# Patient Record
Sex: Male | Born: 1980 | Race: Black or African American | Hispanic: No | Marital: Married | Smoking: Former smoker
Health system: Southern US, Community
[De-identification: ages and names within clinical notes are randomized; demographics above are authoritative.]

## PROBLEM LIST (undated history)

## (undated) DIAGNOSIS — J45909 Unspecified asthma, uncomplicated: Secondary | ICD-10-CM

## (undated) DIAGNOSIS — S060XAA Concussion with loss of consciousness status unknown, initial encounter: Secondary | ICD-10-CM

## (undated) HISTORY — DX: Concussion with loss of consciousness status unknown, initial encounter: S06.0XAA

---

## 2019-01-29 ENCOUNTER — Encounter (HOSPITAL_COMMUNITY): Payer: Self-pay

## 2019-01-29 ENCOUNTER — Other Ambulatory Visit: Payer: Self-pay

## 2019-01-29 ENCOUNTER — Emergency Department (HOSPITAL_COMMUNITY)
Admission: EM | Admit: 2019-01-29 | Discharge: 2019-01-29 | Disposition: A | Discharge status: Home / Self Care | Payer: Self-pay | Attending: Emergency Medicine | Admitting: Emergency Medicine

## 2019-01-29 DIAGNOSIS — L299 Pruritus, unspecified: Secondary | ICD-10-CM | POA: Insufficient documentation

## 2019-01-29 DIAGNOSIS — L259 Unspecified contact dermatitis, unspecified cause: Secondary | ICD-10-CM | POA: Insufficient documentation

## 2019-01-29 MED ORDER — PREDNISONE 10 MG (21) PO TBPK: ORAL_TABLET | Freq: Every day | ORAL | 0 refills | Status: DC

## 2019-01-29 NOTE — ED Provider Notes (Signed)
[facility] Provider Note   CSN: [id number] Arrival date & time: 01/29/19  1502    History   Chief Complaint Chief Complaint  Patient presents with  . Rash    HPI Gabriel Clarke is a 38 y.o. male.     38 year old male presents with complaint of rash to his arms, legs, buttock area.  Patient states rash has been intermittent for the past week, resolves overnight and then throughout the day returns.  Rash is described as a fine bumpy rash.  Patient has tried applying cortisone cream to the area which seems to help with the rash and with the itching however rash returns.  Patient is also taking Zyrtec daily.  Patient has tried changing to sensitive detergents without improvement in symptoms.  No other identifiable factors.  No other complaints or concerns.  No one else in the house noted to have a rash.     History reviewed. No pertinent past medical history.  There are no active problems to display for this patient.   History reviewed. No pertinent surgical history.      Home Medications    Prior to Admission medications   Medication Sig Start Date End Date Taking? Authorizing Provider  predniSONE (STERAPRED UNI-PAK 21 TAB) 10 MG (21) TBPK tablet Take by mouth daily. Take 6 tabs by mouth daily  for 2 days, then 5 tabs for 2 days, then 4 tabs for 2 days, then 3 tabs for 2 days, 2 tabs for 2 days, then 1 tab by mouth daily for 2 days 01/29/19   Jeannie FendMurphy,  A, PA-C    Family History History reviewed. No pertinent family history.  Social History Social History   Tobacco Use  . Smoking status: Never Smoker  . Smokeless tobacco: Never Used  Substance Use Topics  . Alcohol use: Yes    Comment: occ  . Drug use: Yes    Types: Marijuana    Comment: occ     Allergies   Patient has no known allergies.   Review of Systems Review of Systems  Constitutional: Negative for fever.  HENT: Negative for congestion, rhinorrhea,  sinus pressure, sinus pain, sneezing and sore throat.   Respiratory: Negative for shortness of breath and wheezing.   Musculoskeletal: Negative for arthralgias and myalgias.  Skin: Positive for rash. Negative for wound.  Allergic/Immunologic: Negative for immunocompromised state.  Hematological: Negative for adenopathy. Does not bruise/bleed easily.  Psychiatric/Behavioral: Negative for confusion.  All other systems reviewed and are negative.    Physical Exam Updated Vital Signs BP 127/69 (BP Location: Left Arm)   Pulse 63   Temp 98 F (36.7 C) (Oral)   Resp 16   SpO2 100%   Physical Exam Vitals signs and nursing note reviewed.  Constitutional:      General: He is not in acute distress.    Appearance: He is well-developed. He is not diaphoretic.  HENT:     Head: Normocephalic and atraumatic.  Pulmonary:     Effort: Pulmonary effort is normal.  Skin:    General: Skin is warm and dry.     Findings: Rash present. No erythema.     Comments: Fine papular rash noted to upper extremities.  Patient states rash to lower extremities has resolved as of this time.  Neurological:     Mental Status: He is alert and oriented to person, place, and time.  Psychiatric:        Behavior: Behavior normal.  ED Treatments / Results  Labs (all labs ordered are listed, but only abnormal results are displayed) Labs Reviewed - No data to display  EKG None  Radiology No results found.  Procedures Procedures (including critical care time)  Medications Ordered in ED Medications - No data to display   Initial Impression / Assessment and Plan / ED Course  I have reviewed the triage vital signs and the nursing notes.  Pertinent labs & imaging results that were available during my care of the patient were reviewed by me and considered in my medical decision making (see chart for details).  Clinical Course as of Jan 28 1717  Mon Jan 29, 2019  [id number] 38 year old male with no  significant past medical history presents with complaint of intermittent rash x1 month.  On exam patient has a fine papular rash to the upper extremities without signs of secondary infection.  Patient given prescription for prednisone taper, recommend apply Benadryl topically to area as needed and continue with Zyrtec daily.  Patient should recheck with PCP or see dermatology once the office is return after pandemic if rash persists.  Return to ER for severe concerning symptoms.   [LM]    Clinical Course User Index [LM] Jeannie Fend, PA-C      Final Clinical Impressions(s) / ED Diagnoses   Final diagnoses:  Contact dermatitis, unspecified contact dermatitis type, unspecified trigger    ED Discharge Orders         Ordered    predniSONE (STERAPRED UNI-PAK 21 TAB) 10 MG (21) TBPK tablet  Daily     01/29/19 1714           Jeannie Fend, PA-C 01/29/19 1718    Sabas Sous, MD 01/30/19 1101

## 2019-01-29 NOTE — ED Triage Notes (Signed)
Pt reports rash to the back of his arms and to his buttocks for several days. He has tried OTC with no improvement. Reports some itching. Denies oral swelling or SOB.

## 2019-01-29 NOTE — Discharge Instructions (Addendum)
Prednisone as prescribed and complete the full course. Apply Benadryl cream to rash if needed for itching. Continue with Zyrtec or other allergy pill daily. Follow-up with primary care provider or see dermatology if rash persists.

## 2019-07-04 ENCOUNTER — Emergency Department (HOSPITAL_COMMUNITY): Payer: Self-pay

## 2019-07-04 ENCOUNTER — Emergency Department (HOSPITAL_COMMUNITY)
Admission: EM | Admit: 2019-07-04 | Discharge: 2019-07-04 | Disposition: A | Discharge status: Home / Self Care | Payer: Self-pay | Attending: Emergency Medicine | Admitting: Emergency Medicine

## 2019-07-04 ENCOUNTER — Other Ambulatory Visit: Payer: Self-pay

## 2019-07-04 DIAGNOSIS — Z20828 Contact with and (suspected) exposure to other viral communicable diseases: Secondary | ICD-10-CM | POA: Insufficient documentation

## 2019-07-04 DIAGNOSIS — J40 Bronchitis, not specified as acute or chronic: Secondary | ICD-10-CM | POA: Insufficient documentation

## 2019-07-04 MED ORDER — ALBUTEROL SULFATE HFA 108 (90 BASE) MCG/ACT IN AERS
2.0000 | INHALATION_SPRAY | RESPIRATORY_TRACT | Status: DC | PRN
  Administered 2019-07-04: 2 via RESPIRATORY_TRACT
  Filled 2019-07-04: qty 6.7

## 2019-07-04 NOTE — ED Provider Notes (Signed)
[facility] EMERGENCY DEPARTMENT Provider Note   CSN: [id number] Arrival date & time: 07/04/19  1559     History   Chief Complaint Chief Complaint  Patient presents with   Cough    HPI Gabriel Clarke is a 38 y.o. male.     38 year old male presents with complaint of cough x3 days.  Reports dry cough at onset, now with clear sputum.  Also reports wheezing, no history of asthma or wheezing previously.  Patient is a non-smoker, does smoke marijuana.  Patient ports coughing so hard that his stomach is sore, denies nausea, vomiting, changes in bowel or bladder habits or changes in appetite.  No known sick contacts.  No other complaints or concerns.     No past medical history on file.  There are no active problems to display for this patient.   No past surgical history on file.      Home Medications    Prior to Admission medications   Medication Sig Start Date End Date Taking? Authorizing Provider  predniSONE (STERAPRED UNI-PAK 21 TAB) 10 MG (21) TBPK tablet Take by mouth daily. Take 6 tabs by mouth daily  for 2 days, then 5 tabs for 2 days, then 4 tabs for 2 days, then 3 tabs for 2 days, 2 tabs for 2 days, then 1 tab by mouth daily for 2 days 01/29/19   Jeannie Fend, PA-C    Family History No family history on file.  Social History Social History   Tobacco Use   Smoking status: Never Smoker   Smokeless tobacco: Never Used  Substance Use Topics   Alcohol use: Yes    Comment: occ   Drug use: Yes    Types: Marijuana    Comment: occ     Allergies   Patient has no known allergies.   Review of Systems Review of Systems  Constitutional: Negative for fever.  Respiratory: Positive for cough and wheezing. Negative for shortness of breath.   Gastrointestinal: Negative for abdominal pain, constipation, diarrhea, nausea and vomiting.  Musculoskeletal: Positive for myalgias.  Skin: Negative for rash and wound.  Allergic/Immunologic:  Negative for immunocompromised state.  Neurological: Negative for headaches.  Hematological: Negative for adenopathy.  All other systems reviewed and are negative.    Physical Exam Updated Vital Signs BP 112/71 (BP Location: Right Arm)    Pulse 65    Temp 98.9 F (37.2 C) (Oral)    Resp 15    SpO2 99%   Physical Exam Vitals signs and nursing note reviewed.  Constitutional:      General: He is not in acute distress.    Appearance: He is well-developed. He is not diaphoretic.  HENT:     Head: Normocephalic and atraumatic.     Nose: Nose normal.     Mouth/Throat:     Mouth: Mucous membranes are moist.  Neck:     Musculoskeletal: Neck supple.  Cardiovascular:     Rate and Rhythm: Normal rate and regular rhythm.     Pulses: Normal pulses.     Heart sounds: Normal heart sounds.  Pulmonary:     Effort: Pulmonary effort is normal.     Breath sounds: Wheezing present.  Abdominal:     Palpations: Abdomen is soft.     Tenderness: There is no abdominal tenderness. There is no right CVA tenderness, left CVA tenderness or guarding.  Musculoskeletal:     Right lower leg: No edema.     Left lower  leg: No edema.  Lymphadenopathy:     Cervical: No cervical adenopathy.  Skin:    General: Skin is warm and dry.  Neurological:     Mental Status: He is alert and oriented to person, place, and time.  Psychiatric:        Behavior: Behavior normal.      ED Treatments / Results  Labs (all labs ordered are listed, but only abnormal results are displayed) Labs Reviewed  NOVEL CORONAVIRUS, NAA (HOSP ORDER, SEND-OUT TO REF LAB; TAT 18-24 HRS)    EKG None  Radiology Dg Chest Port 1 View  Result Date: 07/04/2019 CLINICAL DATA:  Productive cough for 2 days. EXAM: PORTABLE CHEST 1 VIEW COMPARISON:  None. FINDINGS: The heart size and mediastinal contours are within normal limits. Both lungs are clear. The visualized skeletal structures are unremarkable. IMPRESSION: Negative AP view of the  chest. Electronically Signed   By: Keith Rake M.D.   On: 07/04/2019 20:19    Procedures Procedures (including critical care time)  Medications Ordered in ED Medications  albuterol (VENTOLIN HFA) 108 (90 Base) MCG/ACT inhaler 2 puff (2 puffs Inhalation Given 07/04/19 2046)     Initial Impression / Assessment and Plan / ED Course  I have reviewed the triage vital signs and the nursing notes.  Pertinent labs & imaging results that were available during my care of the patient were reviewed by me and considered in my medical decision making (see chart for details).  Clinical Course as of Jul 04 2139  Wed Jul 03, 6274  [id number] 38 year old male with complaint of productive cough x3 days with soreness in his abdomen secondary to coughing.  No known sick contacts, patient states his symptoms are due to working out in the cold rainy weather.  Patient also reports he has been wheezing, no history of wheezing or asthma previously.  On exam patient is well-appearing, respirations are even and unlabored, he does have mild wheezing and coarse lung sounds throughout.  Chest x-ray is unremarkable.  Abdomen is soft and nontender. Patient was given inhaler with instruction for use while in the emergency room for his wheezing and cough.  Patient was also swabbed for COVID, advised to quarantine at home.  With complaint of abdominal pain/soreness with coughing, patient was advised return to ER for fever, lack of appetite, abdominal pain.   [LM]    Clinical Course User Index [LM] Tacy Learn, PA-C      Final Clinical Impressions(s) / ED Diagnoses   Final diagnoses:  Bronchitis    ED Discharge Orders    None       Roque Lias 07/04/19 2140    Gareth Morgan, MD 07/05/19 1228

## 2019-07-04 NOTE — ED Triage Notes (Signed)
Pt here for productive cough x 2 days. Pt sts his left side cramped up yesterday while coughing and has some residual muscle pain in abdomen from that. Denies fevers/sick contacts.

## 2019-07-04 NOTE — Discharge Instructions (Addendum)
Quarantine at home until you know your test results.  Use your inhaler every 4 hours as needed for wheezing and cough. Return to the ER for worsening of abdominal pain, vomiting, fever, change in appetite.  If your COVID test is positive, you will need to stay out of work until October 11.

## 2019-07-05 ENCOUNTER — Telehealth: Payer: Self-pay

## 2019-07-05 NOTE — Telephone Encounter (Signed)
Received call from patient to check Covid results.  Advised no results at this time.  

## 2019-07-06 ENCOUNTER — Telehealth: Payer: Self-pay | Admitting: *Deleted

## 2019-07-06 LAB — NOVEL CORONAVIRUS, NAA (HOSP ORDER, SEND-OUT TO REF LAB; TAT 18-24 HRS): SARS-CoV-2, NAA: NOT DETECTED

## 2019-07-06 NOTE — Telephone Encounter (Signed)
Patient called to obtain COVID 19 test results from test done in [facility] ED on 07/04/2019.  Patient notified of negative results.

## 2020-09-11 IMAGING — DX DG CHEST 1V PORT
1 series · 1 of 1 positions shown · non-contrast
Comparison: None.

CLINICAL DATA: Productive cough for 2 days.

EXAM:
PORTABLE CHEST 1 VIEW

[chest]
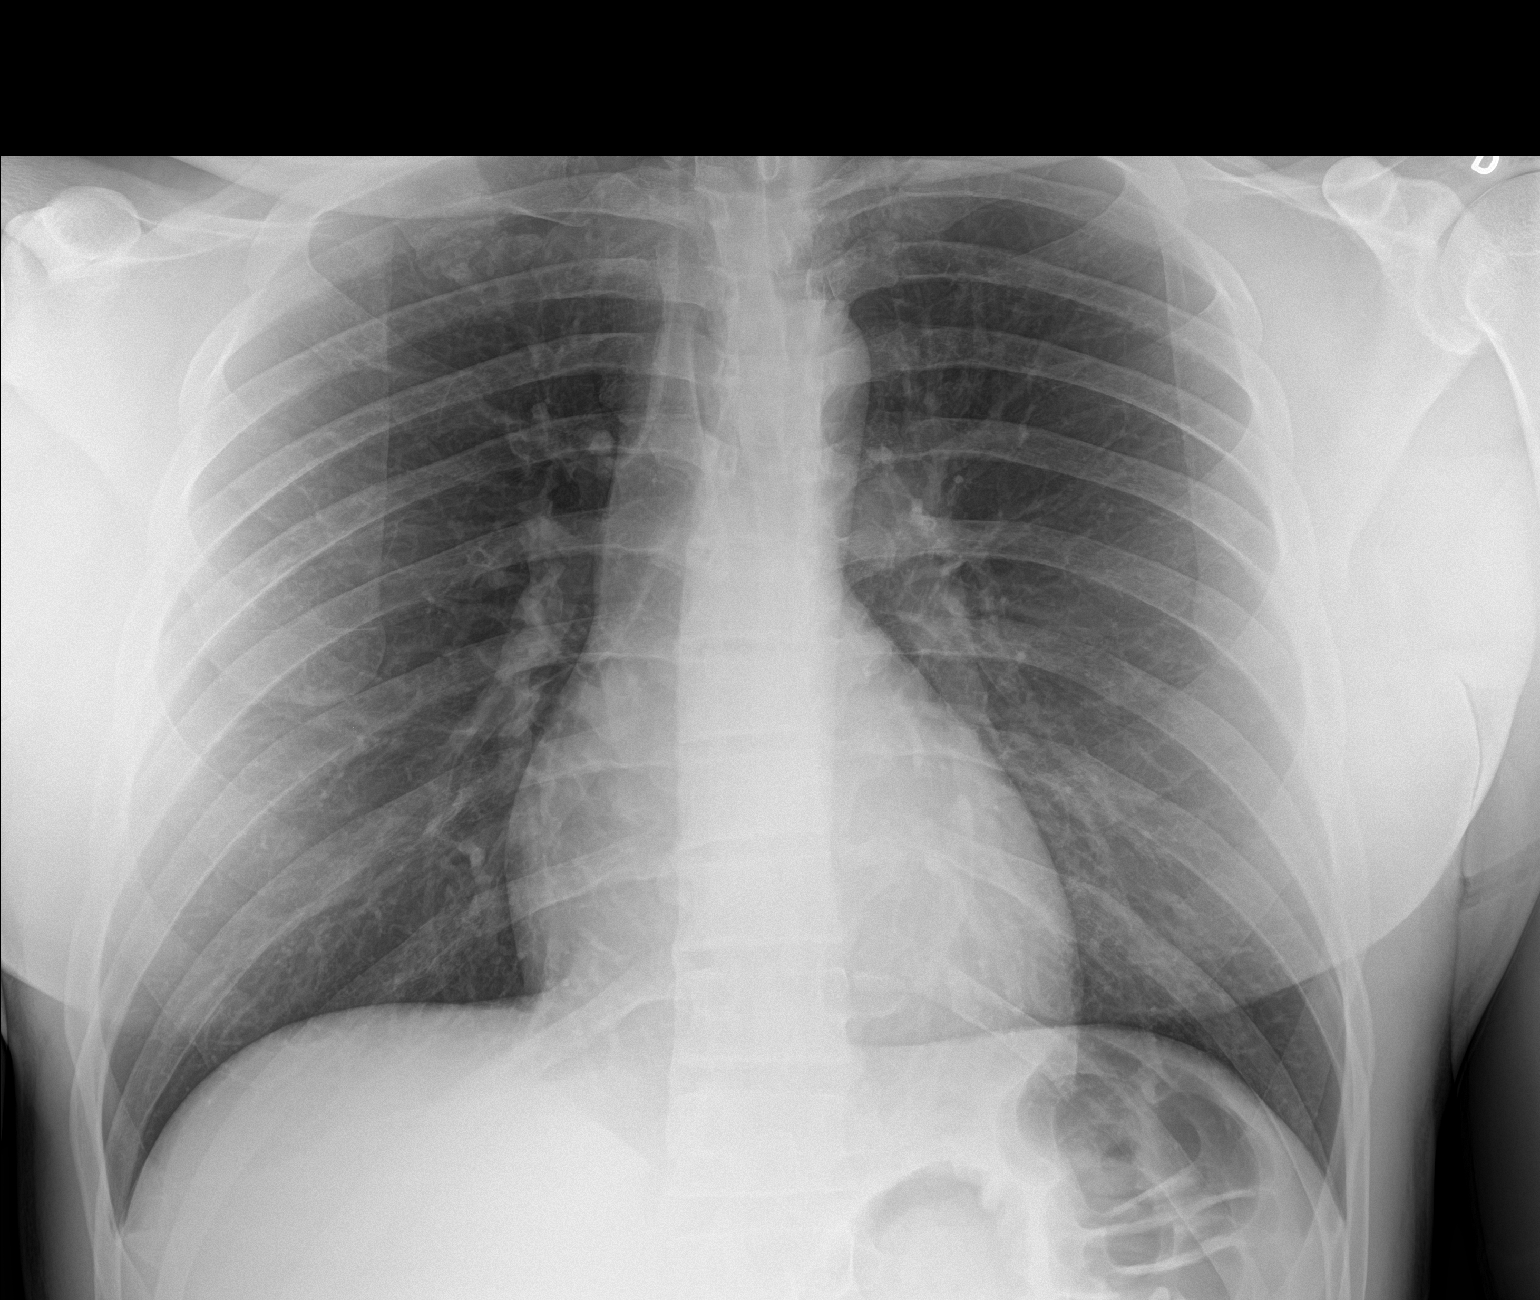

[1 of 1 positions shown; findings below may reference images not displayed]

FINDINGS: The heart size and mediastinal contours are within normal limits.
Both lungs are clear. The visualized skeletal structures are
unremarkable.
IMPRESSION: Negative AP view of the chest.

## 2020-09-24 ENCOUNTER — Encounter (HOSPITAL_COMMUNITY): Payer: Self-pay

## 2020-09-24 ENCOUNTER — Emergency Department (HOSPITAL_COMMUNITY)
Admission: EM | Admit: 2020-09-24 | Discharge: 2020-09-24 | Disposition: A | Discharge status: Home / Self Care | Payer: Self-pay | Attending: Emergency Medicine | Admitting: Emergency Medicine

## 2020-09-24 ENCOUNTER — Other Ambulatory Visit: Payer: Self-pay

## 2020-09-24 DIAGNOSIS — K0889 Other specified disorders of teeth and supporting structures: Secondary | ICD-10-CM | POA: Insufficient documentation

## 2020-09-24 DIAGNOSIS — Z5321 Procedure and treatment not carried out due to patient leaving prior to being seen by health care provider: Secondary | ICD-10-CM | POA: Insufficient documentation

## 2020-09-24 NOTE — ED Notes (Signed)
Called 3x+ times for vitals and registration over the last hour w/o response.

## 2020-09-24 NOTE — ED Notes (Signed)
Called pt for vitals no answer X3 

## 2020-09-24 NOTE — ED Triage Notes (Signed)
Pt endorsing 10/10 pain on R side of mouth x5 days, states he believes a wisdom tooth is coming in. No swelling noted in triage, pt speaking in full sentences, NAD noted.

## 2021-02-11 ENCOUNTER — Other Ambulatory Visit: Payer: Self-pay

## 2021-02-11 ENCOUNTER — Encounter (HOSPITAL_COMMUNITY): Payer: Self-pay

## 2021-02-11 ENCOUNTER — Ambulatory Visit (HOSPITAL_COMMUNITY)
Admission: EM | Admit: 2021-02-11 | Discharge: 2021-02-11 | Disposition: A | Discharge status: Home / Self Care | Payer: Self-pay | Attending: Physician Assistant | Admitting: Physician Assistant

## 2021-02-11 DIAGNOSIS — J9801 Acute bronchospasm: Secondary | ICD-10-CM

## 2021-02-11 DIAGNOSIS — R058 Other specified cough: Secondary | ICD-10-CM

## 2021-02-11 DIAGNOSIS — J329 Chronic sinusitis, unspecified: Secondary | ICD-10-CM

## 2021-02-11 DIAGNOSIS — J40 Bronchitis, not specified as acute or chronic: Secondary | ICD-10-CM

## 2021-02-11 MED ORDER — AMOXICILLIN-POT CLAVULANATE 875-125 MG PO TABS: 1.0000 | ORAL_TABLET | Freq: Two times a day (BID) | ORAL | 0 refills | Status: DC

## 2021-02-11 MED ORDER — METHYLPREDNISOLONE ACETATE 80 MG/ML IJ SUSP
INTRAMUSCULAR | Status: AC
  Filled 2021-02-11: qty 1

## 2021-02-11 MED ORDER — PREDNISONE 10 MG (21) PO TBPK: ORAL_TABLET | ORAL | 0 refills | Status: DC

## 2021-02-11 MED ORDER — ALBUTEROL SULFATE HFA 108 (90 BASE) MCG/ACT IN AERS
2.0000 | INHALATION_SPRAY | Freq: Once | RESPIRATORY_TRACT | Status: AC
  Administered 2021-02-11: 2 via RESPIRATORY_TRACT

## 2021-02-11 MED ORDER — METHYLPREDNISOLONE SODIUM SUCC 125 MG IJ SOLR
INTRAMUSCULAR | Status: AC
  Filled 2021-02-11: qty 2

## 2021-02-11 MED ORDER — METHYLPREDNISOLONE ACETATE 80 MG/ML IJ SUSP
60.0000 mg | Freq: Once | INTRAMUSCULAR | Status: AC
  Administered 2021-02-11: 60 mg via INTRAMUSCULAR

## 2021-02-11 MED ORDER — ALBUTEROL SULFATE HFA 108 (90 BASE) MCG/ACT IN AERS: 1.0000 | INHALATION_SPRAY | Freq: Four times a day (QID) | RESPIRATORY_TRACT | 0 refills | Status: DC | PRN

## 2021-02-11 MED ORDER — ALBUTEROL SULFATE HFA 108 (90 BASE) MCG/ACT IN AERS
INHALATION_SPRAY | RESPIRATORY_TRACT | Status: AC
  Filled 2021-02-11: qty 6.7

## 2021-02-11 NOTE — Discharge Instructions (Signed)
Use albuterol inhaler every 4-6 hours as needed.  Take antibiotic twice a day.  Start prednisone taper tomorrow.  While you are taking this, please do not take NSAIDs including aspirin, ibuprofen/Advil, naproxen/Aleve.  Use Mucinex and Flonase for symptom relief.  Drink plenty of fluid.  If anything worsens please return for reevaluation.

## 2021-02-11 NOTE — ED Provider Notes (Signed)
[facility]    CSN: [id number] Arrival date & time: 02/11/21  1041      History   Chief Complaint Chief Complaint  Patient presents with  . Cough  . Sore Throat  . Shortness of Breath    HPI Gabriel Clarke is a 40 y.o. male.   Patient presents today with a 1 week history of worsening cough.  Reports associated hoarseness, sinus pressure, sore throat, chest tightness, shortness of breath.  Denies any chest pain, fever, abdominal pain, nausea, vomiting, headaches, dizziness.  He denies any known sick contacts.  He has not had influenza or COVID-19 vaccinations.  He was tested for COVID when symptoms first began and then was negative.  He has tried over-the-counter medications including antihistamines and Mucinex without improvement of symptoms.  He does have a history of allergies but denies history of asthma, COPD, smoking.  Denies any recent antibiotic use.  He is having trouble with daily activities as a result of symptoms.  He has missed work due to illness.     History reviewed. No pertinent past medical history.  There are no problems to display for this patient.   History reviewed. No pertinent surgical history.     Home Medications    Prior to Admission medications   Medication Sig Start Date End Date Taking? Authorizing Provider  albuterol (VENTOLIN HFA) 108 (90 Base) MCG/ACT inhaler Inhale 1-2 puffs into the lungs every 6 (six) hours as needed for wheezing or shortness of breath. 02/11/21  Yes Fowler Antos K, PA-C  amoxicillin-clavulanate (AUGMENTIN) 875-125 MG tablet Take 1 tablet by mouth every 12 (twelve) hours. 02/11/21  Yes Railey Glad K, PA-C  predniSONE (STERAPRED UNI-PAK 21 TAB) 10 MG (21) TBPK tablet As directed 02/11/21  Yes Siaosi Alter, Noberto Retort, PA-C    Family History History reviewed. No pertinent family history.  Social History Social History   Tobacco Use  . Smoking status: Never Smoker  . Smokeless tobacco: Never Used  Substance  Use Topics  . Alcohol use: Yes    Comment: occ  . Drug use: Yes    Types: Marijuana    Comment: occ     Allergies   Patient has no known allergies.   Review of Systems Review of Systems  Constitutional: Positive for activity change and fatigue. Negative for appetite change and fever.  HENT: Positive for congestion, sinus pressure and sore throat. Negative for sneezing.   Respiratory: Positive for cough, chest tightness, shortness of breath and wheezing.   Cardiovascular: Negative for chest pain.  Gastrointestinal: Negative for abdominal pain, diarrhea, nausea and vomiting.  Musculoskeletal: Negative for arthralgias and myalgias.  Neurological: Negative for dizziness, light-headedness and headaches.     Physical Exam Triage Vital Signs ED Triage Vitals  Enc Vitals Group     BP 02/11/21 1252 124/68     Pulse Rate 02/11/21 1251 64     Resp 02/11/21 1251 19     Temp 02/11/21 1251 98 F (36.7 C)     Temp Source 02/11/21 1251 Oral     SpO2 02/11/21 1251 99 %     Weight --      Height --      Head Circumference --      Peak Flow --      Pain Score 02/11/21 1250 8     Pain Loc --      Pain Edu? --      Excl. in GC? --    No data  found.  Updated Vital Signs BP 124/68   Pulse 64   Temp 98 F (36.7 C) (Oral)   Resp 19   SpO2 99%   Visual Acuity Right Eye Distance:   Left Eye Distance:   Bilateral Distance:    Right Eye Near:   Left Eye Near:    Bilateral Near:     Physical Exam Vitals reviewed.  Constitutional:      General: He is awake.     Appearance: Normal appearance. He is normal weight. He is not ill-appearing.     Comments: Very pleasant male appears stated age in no acute distress  HENT:     Head: Normocephalic and atraumatic.     Right Ear: Tympanic membrane, ear canal and external ear normal. Tympanic membrane is not erythematous or bulging.     Left Ear: Tympanic membrane, ear canal and external ear normal. Tympanic membrane is not erythematous  or bulging.     Nose:     Right Sinus: Maxillary sinus tenderness and frontal sinus tenderness present.     Left Sinus: Maxillary sinus tenderness and frontal sinus tenderness present.     Mouth/Throat:     Pharynx: Uvula midline. Posterior oropharyngeal erythema present. No oropharyngeal exudate.     Comments: Moderate erythema and drainage in posterior oropharynx Cardiovascular:     Rate and Rhythm: Normal rate and regular rhythm.     Heart sounds: No murmur heard.   Pulmonary:     Effort: Pulmonary effort is normal. No accessory muscle usage or respiratory distress.     Breath sounds: No stridor. Wheezing present. No rhonchi or rales.     Comments: Widespread wheezing throughout lung fields.  Reactive cough with deep breathing. Abdominal:     General: Bowel sounds are normal.     Palpations: Abdomen is soft.     Tenderness: There is no abdominal tenderness.  Lymphadenopathy:     Head:     Right side of head: No submental, submandibular or tonsillar adenopathy.     Left side of head: No submental, submandibular or tonsillar adenopathy.     Cervical: No cervical adenopathy.  Neurological:     Mental Status: He is alert.  Psychiatric:        Behavior: Behavior is cooperative.      UC Treatments / Results  Labs (all labs ordered are listed, but only abnormal results are displayed) Labs Reviewed - No data to display  EKG   Radiology No results found.  Procedures Procedures (including critical care time)  Medications Ordered in UC Medications  albuterol (VENTOLIN HFA) 108 (90 Base) MCG/ACT inhaler 2 puff (2 puffs Inhalation Given 02/11/21 1420)  methylPREDNISolone acetate (DEPO-MEDROL) injection 60 mg (60 mg Intramuscular Given 02/11/21 1424)    Initial Impression / Assessment and Plan / UC Course  I have reviewed the triage vital signs and the nursing notes.  Pertinent labs & imaging results that were available during my care of the patient were reviewed by me and  considered in my medical decision making (see chart for details).     Patient had significant improvement with in office albuterol.  He was given Depo-Medrol 60 mg.  He was discharged home with albuterol, Augmentin, prednisone.  He was instructed to start prednisone tomorrow and not to take NSAIDs with this medication.  He was provided a work excuse note.  Recommended he use over-the-counter medications including Flonase and Mucinex to manage symptoms.  Discussed that if he has any worsening symptoms  he is to return for reevaluation.  Strict return precautions given to which patient expressed understanding.  Final Clinical Impressions(s) / UC Diagnoses   Final diagnoses:  Sinobronchitis  Productive cough  Bronchospasm     Discharge Instructions     Use albuterol inhaler every 4-6 hours as needed.  Take antibiotic twice a day.  Start prednisone taper tomorrow.  While you are taking this, please do not take NSAIDs including aspirin, ibuprofen/Advil, naproxen/Aleve.  Use Mucinex and Flonase for symptom relief.  Drink plenty of fluid.  If anything worsens please return for reevaluation.    ED Prescriptions    Medication Sig Dispense Auth. Provider   albuterol (VENTOLIN HFA) 108 (90 Base) MCG/ACT inhaler Inhale 1-2 puffs into the lungs every 6 (six) hours as needed for wheezing or shortness of breath. 8 g Furkan Keenum K, PA-C   amoxicillin-clavulanate (AUGMENTIN) 875-125 MG tablet Take 1 tablet by mouth every 12 (twelve) hours. 14 tablet Sarina Robleto K, PA-C   predniSONE (STERAPRED UNI-PAK 21 TAB) 10 MG (21) TBPK tablet As directed 21 tablet Bryant Saye K, PA-C     PDMP not reviewed this encounter.   Jeani Hawking, PA-C 02/11/21 1431

## 2021-02-11 NOTE — ED Triage Notes (Signed)
Pt in with c/o productive cough, sob, ST x 1 work  Pt took allergy medication with no relief from sx

## 2021-03-24 ENCOUNTER — Encounter (HOSPITAL_BASED_OUTPATIENT_CLINIC_OR_DEPARTMENT_OTHER): Payer: Self-pay | Admitting: Emergency Medicine

## 2021-03-24 ENCOUNTER — Other Ambulatory Visit: Payer: Self-pay

## 2021-03-24 ENCOUNTER — Emergency Department (HOSPITAL_BASED_OUTPATIENT_CLINIC_OR_DEPARTMENT_OTHER): Payer: Self-pay

## 2021-03-24 ENCOUNTER — Emergency Department (HOSPITAL_BASED_OUTPATIENT_CLINIC_OR_DEPARTMENT_OTHER)
Admission: EM | Admit: 2021-03-24 | Discharge: 2021-03-24 | Disposition: A | Discharge status: Home / Self Care | Payer: Self-pay | Attending: Emergency Medicine | Admitting: Emergency Medicine

## 2021-03-24 DIAGNOSIS — L089 Local infection of the skin and subcutaneous tissue, unspecified: Secondary | ICD-10-CM

## 2021-03-24 DIAGNOSIS — F17210 Nicotine dependence, cigarettes, uncomplicated: Secondary | ICD-10-CM | POA: Insufficient documentation

## 2021-03-24 DIAGNOSIS — J45909 Unspecified asthma, uncomplicated: Secondary | ICD-10-CM | POA: Insufficient documentation

## 2021-03-24 DIAGNOSIS — T8149XA Infection following a procedure, other surgical site, initial encounter: Secondary | ICD-10-CM | POA: Insufficient documentation

## 2021-03-24 DIAGNOSIS — Z23 Encounter for immunization: Secondary | ICD-10-CM | POA: Insufficient documentation

## 2021-03-24 HISTORY — DX: Unspecified asthma, uncomplicated: J45.909

## 2021-03-24 MED ORDER — BACITRACIN ZINC 500 UNIT/GM EX OINT
TOPICAL_OINTMENT | Freq: Two times a day (BID) | CUTANEOUS | Status: DC
  Administered 2021-03-24: 1 via TOPICAL
  Filled 2021-03-24: qty 28.35

## 2021-03-24 MED ORDER — DOXYCYCLINE HYCLATE 100 MG PO CAPS: 100.0000 mg | ORAL_CAPSULE | Freq: Two times a day (BID) | ORAL | 0 refills | Status: DC

## 2021-03-24 MED ORDER — TETANUS-DIPHTH-ACELL PERTUSSIS 5-2.5-18.5 LF-MCG/0.5 IM SUSY
0.5000 mL | PREFILLED_SYRINGE | Freq: Once | INTRAMUSCULAR | Status: AC
  Administered 2021-03-24: 0.5 mL via INTRAMUSCULAR
  Filled 2021-03-24: qty 0.5

## 2021-03-24 MED ORDER — DOXYCYCLINE HYCLATE 100 MG PO TABS
100.0000 mg | ORAL_TABLET | Freq: Once | ORAL | Status: AC
  Administered 2021-03-24: 100 mg via ORAL
  Filled 2021-03-24: qty 1

## 2021-03-24 NOTE — ED Triage Notes (Signed)
Pt states he stepped on a nail 8 days ago  Pt states his foot is painful  Pt states his foot is red and swollen  Pt states he also needs a tetanus shot

## 2021-03-24 NOTE — ED Provider Notes (Signed)
[facility] EMERGENCY DEPARTMENT Provider Note   CSN: [id number] Arrival date & time: 03/24/21  0234     History Chief Complaint  Patient presents with   Foot Pain    Gabriel Clarke is a 40 y.o. male.  The history is provided by the patient.  Foot Pain This is a new problem. The current episode started more than 1 week ago (9 days ago). The problem occurs constantly. The problem has not changed since onset.Pertinent negatives include no chest pain, no abdominal pain, no headaches and no shortness of breath. Nothing aggravates the symptoms. Nothing relieves the symptoms. He has tried nothing for the symptoms. The treatment provided no relief.  Nail lacerated instep of left foot not through a shoe.      Past Medical History:  Diagnosis Date   Asthma     There are no problems to display for this patient.   History reviewed. No pertinent surgical history.     History reviewed. No pertinent family history.  Social History   Tobacco Use   Smoking status: Every Day    Packs/day: 0.25    Pack years: 0.00    Types: Cigarettes   Smokeless tobacco: Never  Vaping Use   Vaping Use: Never used  Substance Use Topics   Alcohol use: Not Currently    Comment: occ   Drug use: Yes    Types: Marijuana    Comment: occ    Home Medications Prior to Admission medications   Medication Sig Start Date End Date Taking? Authorizing Provider  doxycycline (VIBRAMYCIN) 100 MG capsule Take 1 capsule (100 mg total) by mouth 2 (two) times daily. One po bid x 7 days 03/24/21  Yes Eimi Viney, MD  albuterol (VENTOLIN HFA) 108 (90 Base) MCG/ACT inhaler Inhale 1-2 puffs into the lungs every 6 (six) hours as needed for wheezing or shortness of breath. 02/11/21   Raspet, Noberto Retort, PA-C  amoxicillin-clavulanate (AUGMENTIN) 875-125 MG tablet Take 1 tablet by mouth every 12 (twelve) hours. 02/11/21   Raspet, Noberto Retort, PA-C  predniSONE (STERAPRED UNI-PAK 21 TAB) 10 MG (21) TBPK tablet As  directed 02/11/21   Raspet, Noberto Retort, PA-C    Allergies    Patient has no known allergies.  Review of Systems   Review of Systems  Constitutional:  Negative for fever.  HENT:  Negative for drooling.   Eyes:  Negative for redness.  Respiratory:  Negative for shortness of breath.   Cardiovascular:  Negative for chest pain.  Gastrointestinal:  Negative for abdominal pain.  Genitourinary:  Negative for difficulty urinating.  Musculoskeletal:  Positive for arthralgias.  Skin:  Positive for wound.  Neurological:  Negative for headaches.  Psychiatric/Behavioral:  Negative for agitation.   All other systems reviewed and are negative.  Physical Exam Updated Vital Signs BP 113/87 (BP Location: Left Arm)   Pulse 70   Temp 98 F (36.7 C) (Oral)   Resp 14   Ht 6' (1.829 m)   Wt 93 kg   SpO2 98%   BMI 27.80 kg/m   Physical Exam Vitals and nursing note reviewed.  Constitutional:      General: He is not in acute distress.    Appearance: Normal appearance.  HENT:     Head: Normocephalic and atraumatic.     Nose: Nose normal.  Eyes:     Conjunctiva/sclera: Conjunctivae normal.     Pupils: Pupils are equal, round, and reactive to light.  Cardiovascular:     Rate  and Rhythm: Normal rate and regular rhythm.     Pulses: Normal pulses.     Heart sounds: Normal heart sounds.  Pulmonary:     Effort: Pulmonary effort is normal.     Breath sounds: Normal breath sounds.  Abdominal:     General: Abdomen is flat. Bowel sounds are normal.     Palpations: Abdomen is soft.  Musculoskeletal:        General: Normal range of motion.     Cervical back: Normal range of motion and neck supple.     Right foot: Normal.       Feet:  Skin:    General: Skin is warm and dry.     Capillary Refill: Capillary refill takes less than 2 seconds.  Neurological:     General: No focal deficit present.     Mental Status: He is alert and oriented to person, place, and time.     Deep Tendon Reflexes: Reflexes  normal.  Psychiatric:        Mood and Affect: Mood normal.        Behavior: Behavior normal.    ED Results / Procedures / Treatments   Labs (all labs ordered are listed, but only abnormal results are displayed) Labs Reviewed - No data to display  EKG None  Radiology No results found.  Procedures Procedures   Medications Ordered in ED Medications  Tdap (BOOSTRIX) injection 0.5 mL (has no administration in time range)  doxycycline (VIBRA-TABS) tablet 100 mg (has no administration in time range)  bacitracin ointment (has no administration in time range)    ED Course  I have reviewed the triage vital signs and the nursing notes.  Pertinent labs & imaging results that were available during my care of the patient were reviewed by me and considered in my medical decision making (see chart for details).   This wound is a vertical laceration healing by secondary intention.  It cannot be sutured because it is too old.  As it was not through a shoe.  I will start doxycycline.  I have encouraaged only clean white sock wearing and wound care.  Tetanus updated.    Gabriel Clarke was evaluated in Emergency Department on 03/24/2021 for the symptoms described in the history of present illness. He was evaluated in the context of the global COVID-19 pandemic, which necessitated consideration that the patient might be at risk for infection with the SARS-CoV-2 virus that causes COVID-19. Institutional protocols and algorithms that pertain to the evaluation of patients at risk for COVID-19 are in a state of rapid change based on information released by regulatory bodies including the CDC and federal and state organizations. These policies and algorithms were followed during the patient's care in the ED.  Final Clinical Impression(s) / ED Diagnoses Final diagnoses:  Wound infection   Return for intractable cough, coughing up blood, fevers > 100.4 unrelieved by medication, shortness of breath,  intractable vomiting, chest pain, shortness of breath, weakness, numbness, changes in speech, facial asymmetry, abdominal pain, passing out, Inability to tolerate liquids or food, cough, altered mental status or any concerns. No signs of systemic illness or infection. The patient is nontoxic-appearing on exam and vital signs are within normal limits. I have reviewed the triage vital signs and the nursing notes. Pertinent labs & imaging results that were available during my care of the patient were reviewed by me and considered in my medical decision making (see chart for details). After history, exam, and medical workup  I feel the patient has been appropriately medically screened and is safe for discharge home. Pertinent diagnoses were discussed with the patient. Patient was given return precautions.   Rx / DC Orders ED Discharge Orders          Ordered    doxycycline (VIBRAMYCIN) 100 MG capsule  2 times daily        03/24/21 0402             Hibo Blasdell, MD 03/24/21 0211

## 2021-05-15 ENCOUNTER — Ambulatory Visit: Payer: Medicaid Other | Admitting: Physician Assistant

## 2021-11-25 ENCOUNTER — Telehealth: Payer: Medicaid Other | Admitting: Physician Assistant

## 2021-11-25 DIAGNOSIS — T7840XA Allergy, unspecified, initial encounter: Secondary | ICD-10-CM

## 2021-11-25 MED ORDER — PREDNISONE 10 MG PO TABS: ORAL_TABLET | ORAL | 0 refills | Status: AC

## 2021-11-25 MED ORDER — HYDROXYZINE PAMOATE 25 MG PO CAPS: 25.0000 mg | ORAL_CAPSULE | Freq: Three times a day (TID) | ORAL | 0 refills | Status: DC | PRN

## 2021-11-25 NOTE — Progress Notes (Signed)
Virtual Visit Consent   Gabriel Clarke, you are scheduled for a virtual visit with a [facility] provider today.     Just as with appointments in the office, your consent must be obtained to participate.  Your consent will be active for this visit and any virtual visit you may have with one of our providers in the next 365 days.     If you have a MyChart account, a copy of this consent can be sent to you electronically.  All virtual visits are billed to your insurance company just like a traditional visit in the office.    As this is a virtual visit, video technology does not allow for your provider to perform a traditional examination.  This may limit your provider's ability to fully assess your condition.  If your provider identifies any concerns that need to be evaluated in person or the need to arrange testing (such as labs, EKG, etc.), we will make arrangements to do so.     Although advances in technology are sophisticated, we cannot ensure that it will always work on either your end or our end.  If the connection with a video visit is poor, the visit may have to be switched to a telephone visit.  With either a video or telephone visit, we are not always able to ensure that we have a secure connection.     I need to obtain your verbal consent now.   Are you willing to proceed with your visit today?    Gabriel Clarke has provided verbal consent on 11/25/2021 for a virtual visit (video or telephone).   Piedad Climes, [place]   Date: 11/25/2021 6:45 PM   Virtual Visit via Video Note   I, Piedad Climes, connected with  Gabriel Clarke  ([id number], [birth year]) on 11/25/21 at  7:15 PM EST by a video-enabled telemedicine application and verified that I am speaking with the correct person using two identifiers.  Location: Patient: Virtual Visit Location Patient: Home Provider: Virtual Visit Location Provider: Home Office   I discussed the limitations of  evaluation and management by telemedicine and the availability of in person appointments. The patient expressed understanding and agreed to proceed.    History of Present Illness: Gabriel Clarke is a 41 y.o. who identifies as a male who was assigned male at birth, and is being seen today for lip and cheek swelling starting yesterday evening after having a Pina Colada. 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 He was unaware this drink contained coconut. Felt fine initially but after getting home noted some mild facial itching and puffiness of lips. Notes that today when he woke up swelling was worse. Has levelled out but has not improved. Denies any SOB, racing heart, lightheadedness, tongue swelling or difficulty swallowing. Has been able to go about his routine.   HPI: HPI  Problems: There are no problems to display for this patient.   Allergies:  Allergies  Allergen Reactions   Coconut Oil     Lip swelling with last reaction. No tongue swelling. No SOB, palpitations, etc.    Medications:  Current Outpatient Medications:    hydrOXYzine (VISTARIL) 25 MG capsule, Take 1 capsule (25 mg total) by mouth every 8 (eight) hours as needed., Disp: 30 capsule, Rfl: 0   predniSONE (DELTASONE) 10 MG tablet, Take 4 tablets (40 mg total) by mouth daily with breakfast for 2 days, THEN 3 tablets (30 mg total) daily  with breakfast for 2 days, THEN 2 tablets (20 mg total) daily with breakfast for 2 days, THEN 1 tablet (10 mg total) daily with breakfast for 2 days., Disp: 20 tablet, Rfl: 0  Observations/Objective: Patient is well-developed, well-nourished in no acute distress.  Resting comfortably at home.  Head is normocephalic, atraumatic.  No labored breathing. Speech is clear and coherent with logical content.  Patient is alert and oriented at baseline.  Lip swelling noted, more significant of lower lip. No facial rash noted. Tongue without swelling.   Assessment and  Plan: 1. Allergic reaction, initial encounter - predniSONE (DELTASONE) 10 MG tablet; Take 4 tablets (40 mg total) by mouth daily with breakfast for 2 days, THEN 3 tablets (30 mg total) daily with breakfast for 2 days, THEN 2 tablets (20 mg total) daily with breakfast for 2 days, THEN 1 tablet (10 mg total) daily with breakfast for 2 days.  Dispense: 20 tablet; Refill: 0 - hydrOXYzine (VISTARIL) 25 MG capsule; Take 1 capsule (25 mg total) by mouth every 8 (eight) hours as needed.  Dispense: 30 capsule; Refill: 0  Thankfully no SOB, difficulty swallowing or tongue swelling. This started over 24 hours ago and has levelled off. Will start Prednisone taper and Hydroxyzine to speed up improvement/resolution. Strict ER precautions reviewed. Coconut added to allergen list. He was given link to help get set up with a PCP for ongoing monitoring and possible allergist referral.    Follow Up Instructions: I discussed the assessment and treatment plan with the patient. The patient was provided an opportunity to ask questions and all were answered. The patient agreed with the plan and demonstrated an understanding of the instructions.  A copy of instructions were sent to the patient via MyChart unless otherwise noted below.   The patient was advised to call back or seek an in-person evaluation if the symptoms worsen or if the condition fails to improve as anticipated.  Time:  I spent 12 minutes with the patient via telehealth technology discussing the above problems/concerns.    Piedad Climes, PA-C

## 2022-04-22 DIAGNOSIS — K219 Gastro-esophageal reflux disease without esophagitis: Secondary | ICD-10-CM | POA: Insufficient documentation

## 2022-04-22 DIAGNOSIS — G8929 Other chronic pain: Secondary | ICD-10-CM | POA: Insufficient documentation

## 2022-04-22 DIAGNOSIS — J302 Other seasonal allergic rhinitis: Secondary | ICD-10-CM | POA: Insufficient documentation

## 2022-04-22 DIAGNOSIS — N529 Male erectile dysfunction, unspecified: Secondary | ICD-10-CM | POA: Insufficient documentation

## 2022-04-22 DIAGNOSIS — M545 Low back pain, unspecified: Secondary | ICD-10-CM | POA: Insufficient documentation

## 2023-01-13 ENCOUNTER — Telehealth: Payer: BLUE CROSS/BLUE SHIELD | Admitting: Physician Assistant

## 2023-01-13 DIAGNOSIS — B356 Tinea cruris: Secondary | ICD-10-CM

## 2023-01-13 MED ORDER — KETOCONAZOLE 2 % EX CREA: 1.0000 | TOPICAL_CREAM | Freq: Every day | CUTANEOUS | 0 refills | Status: DC

## 2023-01-13 MED ORDER — TERBINAFINE HCL 250 MG PO TABS: 250.0000 mg | ORAL_TABLET | Freq: Every day | ORAL | 0 refills | Status: DC

## 2023-01-13 NOTE — Patient Instructions (Signed)
Gabriel Clarke, thank you for joining Margaretann Loveless, PA-C for today's virtual visit.  While this provider is not your primary care provider (PCP), if your PCP is located in our provider database this encounter information will be shared with them immediately following your visit.   A [facility] MyChart account gives you access to today's visit and all your visits, tests, and labs performed at [facility] " click here if you don't have a [facility] MyChart account or go to [email or web address]  Consent: (Patient) Gabriel Clarke provided verbal consent for this virtual visit at the beginning of the encounter.  Current Medications:  Current Outpatient Medications:    hydrOXYzine (VISTARIL) 25 MG capsule, Take 1 capsule (25 mg total) by mouth every 8 (eight) hours as needed., Disp: 30 capsule, Rfl: 0   ketoconazole (NIZORAL) 2 % cream, Apply 1 Application topically daily., Disp: 15 g, Rfl: 0   terbinafine (LAMISIL) 250 MG tablet, Take 1 tablet (250 mg total) by mouth daily., Disp: 10 tablet, Rfl: 0   Medications ordered in this encounter:  Meds ordered this encounter  Medications   ketoconazole (NIZORAL) 2 % cream    Sig: Apply 1 Application topically daily.    Dispense:  15 g    Refill:  0    Order Specific Question:   Supervising Provider    Answer:   Merrilee Jansky [[id number]]   terbinafine (LAMISIL) 250 MG tablet    Sig: Take 1 tablet (250 mg total) by mouth daily.    Dispense:  10 tablet    Refill:  0    Order Specific Question:   Supervising Provider    Answer:   Merrilee Jansky [id number]     *If you need refills on other medications prior to your next appointment, please contact your pharmacy*  Follow-Up: Call back or seek an in-person evaluation if the symptoms worsen or if the condition fails to improve as anticipated.   Virtual Care [phone]  Other Instructions  Jock Itch Jock itch (tinea cruris) is an  infection of the skin in the groin area that is caused by a fungus. Jock itch causes an itchy rash in the groin and upper thigh area. It usually goes away in 2-3 weeks with treatment. What are the causes? The fungus that causes jock itch may be spread by: Touching a fungal infection elsewhere on your body, such as athlete's foot, and then touching your groin area. Sharing towels or clothing, such as socks or shoes, with someone who has a fungal infection. What increases the risk? Jock itch is most common in men and adolescent boys. You are also more likely to develop the condition if you: Are in a hot, humid climate. Wear tight-fitting clothing or wet bathing suits for long periods of time. Play sports. Are overweight. Have diabetes. Have a weakened body defense system (immune system). Sweat a lot. What are the signs or symptoms? Symptoms of jock itch may include: A red, pink, or brown rash in the groin area. Blisters may be present. The rash may spread to the thighs, the opening between the buttocks (anus), and the buttocks. Dry and scaly skin on or around the rash. Itchiness. How is this diagnosed? In most cases, your health care provider can make the diagnosis by looking at your rash. In some cases, a sample of infected skin may be scraped off. This sample may be examined under a microscope (biopsy) or by trying to grow the fungus  from the sample (culture). How is this treated? Treatment for this condition may include: Antifungal medicine to kill the fungus. This may be a skin cream, ointment, or powder, or it may be a medicine that you take by mouth (orally). Skin cream or ointment to reduce itching. Lifestyle changes, such as wearing looser clothing and caring for your skin. Follow these instructions at home: Skin care Apply skin creams, ointments, or powders exactly as told by your health care provider. Wear loose-fitting clothing that does not rub against your groin area. Men  should wear boxer shorts or loose-fitting underwear. Keep your groin area clean and dry. Change your underwear every day. Change out of wet bathing suits as soon as possible. After bathing, use a separate towel to gently dry your groin area thoroughly. Using a separate towel will help prevent spreading the infection to other parts of your body. Avoid hot baths and showers. Hot water can make itching worse. Do not scratch the affected area. General instructions Take and apply over-the-counter and prescription medicines only as told by your health care provider. Do not share towels, clothing, or personal items with other people. Wash your hands often with soap and water for at least 20 seconds, especially after touching your groin area. If soap and water are not available, use hand sanitizer. When at the gym: Always wear shoes, especially in the shower and around the swimming pool. Keep any cuts covered. Disinfect any mats or equipment before using them. Shower immediately after working out. Keep all follow-up visits. This is important. Contact a health care provider if: Your rash: Gets worse or does not get better after 2 weeks of treatment. Spreads. Returns after treatment is finished. You have any of the following: A fever. New or worsening redness, swelling, or pain around your rash. Fluid, blood, or pus coming from your rash. Summary Jock itch (tinea cruris) is a fungal infection of the skin in the groin area. The fungus can be spread by sharing clothing or by touching a fungus infection elsewhere on your body and then touching your groin area. Treatment may include antifungal medicine and lifestyle changes, such as keeping the area clean and dry. This information is not intended to replace advice given to you by your health care provider. Make sure you discuss any questions you have with your health care provider. Document Revised: 12/09/2020 Document Reviewed: 12/09/2020 Elsevier  Patient Education  2023 Elsevier Inc.    If you have been instructed to have an in-person evaluation today at a local Urgent Care facility, please use the link below. It will take you to a list of all of our available [facility], including address, phone number and hours of operation. Please do not delay care.  [facility]  If you or a family member do not have a primary care provider, use the link below to schedule a visit and establish care. When you choose a [facility] primary care physician or advanced practice provider, you gain a long-term partner in health. Find a Primary Care Provider  Learn more about [facility]'s in-office and virtual care options:  - Get Care Now

## 2023-01-13 NOTE — Progress Notes (Signed)
Virtual Visit Consent   Gabriel Clarke, you are scheduled for a virtual visit with a [facility] provider today. Just as with appointments in the office, your consent must be obtained to participate. Your consent will be active for this visit and any virtual visit you may have with one of our providers in the next 365 days. If you have a MyChart account, a copy of this consent can be sent to you electronically.  As this is a virtual visit, video technology does not allow for your provider to perform a traditional examination. This may limit your provider's ability to fully assess your condition. If your provider identifies any concerns that need to be evaluated in person or the need to arrange testing (such as labs, EKG, etc.), we will make arrangements to do so. Although advances in technology are sophisticated, we cannot ensure that it will always work on either your end or our end. If the connection with a video visit is poor, the visit may have to be switched to a telephone visit. With either a video or telephone visit, we are not always able to ensure that we have a secure connection.  By engaging in this virtual visit, you consent to the provision of healthcare and authorize for your insurance to be billed (if applicable) for the services provided during this visit. Depending on your insurance coverage, you may receive a charge related to this service.  I need to obtain your verbal consent now. Are you willing to proceed with your visit today? Gabriel Clarke has provided verbal consent on 01/13/2023 for a virtual visit (video or telephone). Gabriel Loveless, PA-C  Date: 01/13/2023 12:02 PM  Virtual Visit via Video Note   I, Gabriel Clarke, connected with  Gabriel Clarke  ([id number], 07-23-1981) on 01/13/23 at 12:00 PM EDT by a video-enabled telemedicine application and verified that I am speaking with the correct person using two identifiers.  Location: Patient:  Virtual Visit Location Patient: Home Provider: Virtual Visit Location Provider: Home Office   I discussed the limitations of evaluation and management by telemedicine and the availability of in person appointments. The patient expressed understanding and agreed to proceed.    History of Present Illness: Gabriel Clarke is a 42 y.o. who identifies as a male who was assigned male at birth, and is being seen today for possible jock itch.  HPI: Rash This is a new problem. The current episode started 1 to 4 weeks ago (Started a week ago after a cruise). The problem has been gradually worsening since onset. The affected locations include the genitalia and groin. The rash is characterized by redness, itchiness and burning. He was exposed to nothing. Pertinent negatives include no cough, facial edema, fatigue or shortness of breath. Past treatments include topical steroids (hydrocortisone and gold bond, tinactin). The treatment provided no relief.     Problems: There are no problems to display for this patient.   Allergies:  Allergies  Allergen Reactions   Coconut (Cocos Nucifera)     Lip swelling with last reaction. No tongue swelling. No SOB, palpitations, etc.    Medications:  Current Outpatient Medications:    hydrOXYzine (VISTARIL) 25 MG capsule, Take 1 capsule (25 mg total) by mouth every 8 (eight) hours as needed., Disp: 30 capsule, Rfl: 0   ketoconazole (NIZORAL) 2 % cream, Apply 1 Application topically daily., Disp: 15 g, Rfl: 0   terbinafine (LAMISIL) 250 MG tablet, Take 1 tablet (250 mg total) by  mouth daily., Disp: 10 tablet, Rfl: 0  Observations/Objective: Patient is well-developed, well-nourished in no acute distress.  Resting comfortably at home.  Head is normocephalic, atraumatic.  No labored breathing.  Speech is clear and coherent with logical content.  Patient is alert and oriented at baseline.    Assessment and Plan: 1. Tinea cruris - ketoconazole (NIZORAL) 2 %  cream; Apply 1 Application topically daily.  Dispense: 15 g; Refill: 0 - terbinafine (LAMISIL) 250 MG tablet; Take 1 tablet (250 mg total) by mouth daily.  Dispense: 10 tablet; Refill: 0  - Suspected tinea cruris from recent vacation - Ketoconazole cream prescribed; Use BID for 10-14 days - Terbinafine orally x 10 days; If recurrent after treatment may require 3-6 week treatment (he is to reach out if recurrent) - Keep skin clean and dry - Avoid tight clothing - Follow up if recurrent or seek in person evaluation if worsening  Follow Up Instructions: I discussed the assessment and treatment plan with the patient. The patient was provided an opportunity to ask questions and all were answered. The patient agreed with the plan and demonstrated an understanding of the instructions.  A copy of instructions were sent to the patient via MyChart unless otherwise noted below.    The patient was advised to call back or seek an in-person evaluation if the symptoms worsen or if the condition fails to improve as anticipated.  Time:  I spent 8 minutes with the patient via telehealth technology discussing the above problems/concerns.    Gabriel Loveless, PA-C

## 2023-01-20 ENCOUNTER — Encounter: Payer: Self-pay | Admitting: Physician Assistant

## 2023-01-20 ENCOUNTER — Other Ambulatory Visit: Payer: Self-pay | Admitting: Physician Assistant

## 2023-01-20 DIAGNOSIS — B356 Tinea cruris: Secondary | ICD-10-CM

## 2023-01-21 ENCOUNTER — Telehealth: Payer: BLUE CROSS/BLUE SHIELD | Admitting: Family Medicine

## 2023-01-21 DIAGNOSIS — Z91199 Patient's noncompliance with other medical treatment and regimen due to unspecified reason: Secondary | ICD-10-CM

## 2023-01-21 NOTE — Progress Notes (Signed)
The patient no-showed for appointment despite this provider sending direct link, reaching out via phone with no response and waiting for at least 10 minutes from appointment time for patient to join. They will be marked as a NS for this appointment/time.   Rayden Scheper M Patsye Sullivant, NP    

## 2023-10-25 ENCOUNTER — Emergency Department: Payer: BLUE CROSS/BLUE SHIELD

## 2023-10-25 ENCOUNTER — Encounter: Payer: Self-pay | Admitting: Intensive Care

## 2023-10-25 ENCOUNTER — Other Ambulatory Visit: Payer: Self-pay

## 2023-10-25 ENCOUNTER — Emergency Department
Admission: EM | Admit: 2023-10-25 | Discharge: 2023-10-25 | Disposition: A | Discharge status: Home / Self Care | Payer: BLUE CROSS/BLUE SHIELD | Attending: Emergency Medicine | Admitting: Emergency Medicine

## 2023-10-25 DIAGNOSIS — R410 Disorientation, unspecified: Secondary | ICD-10-CM | POA: Insufficient documentation

## 2023-10-25 DIAGNOSIS — Y92410 Unspecified street and highway as the place of occurrence of the external cause: Secondary | ICD-10-CM | POA: Insufficient documentation

## 2023-10-25 DIAGNOSIS — J45909 Unspecified asthma, uncomplicated: Secondary | ICD-10-CM | POA: Diagnosis not present

## 2023-10-25 DIAGNOSIS — M542 Cervicalgia: Secondary | ICD-10-CM | POA: Insufficient documentation

## 2023-10-25 DIAGNOSIS — H538 Other visual disturbances: Secondary | ICD-10-CM | POA: Insufficient documentation

## 2023-10-25 DIAGNOSIS — R519 Headache, unspecified: Secondary | ICD-10-CM | POA: Insufficient documentation

## 2023-10-25 NOTE — Discharge Instructions (Signed)
The CT scan of your head and neck was normal today.  You may have a mild concussion.  You will likely be more sore tomorrow than you are today and should gradually improve with time.  If you have concussion symptoms including headache, nausea, blurry vision, sensitivity to light, confusion, fatigue, difficulty sleeping or mood swings for greater than 2 weeks please be seen by another healthcare provider.  This can be done by the ED, urgent care or your primary care provider.  You can take Tylenol and ibuprofen as needed for pain.  I also encourage you to use ice, heat and topical pain relievers.
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
## 2023-11-14 ENCOUNTER — Ambulatory Visit (INDEPENDENT_AMBULATORY_CARE_PROVIDER_SITE_OTHER): Payer: BLUE CROSS/BLUE SHIELD | Admitting: Family

## 2023-11-14 VITALS — BP 107/73 | HR 60 | Temp 98.2°F | Resp 16 | Ht 72.0 in | Wt 231.4 lb

## 2023-11-14 DIAGNOSIS — Z1329 Encounter for screening for other suspected endocrine disorder: Secondary | ICD-10-CM

## 2023-11-14 DIAGNOSIS — Z13228 Encounter for screening for other metabolic disorders: Secondary | ICD-10-CM

## 2023-11-14 DIAGNOSIS — R0683 Snoring: Secondary | ICD-10-CM | POA: Diagnosis not present

## 2023-11-14 DIAGNOSIS — R7303 Prediabetes: Secondary | ICD-10-CM | POA: Diagnosis not present

## 2023-11-14 DIAGNOSIS — K219 Gastro-esophageal reflux disease without esophagitis: Secondary | ICD-10-CM | POA: Diagnosis not present

## 2023-11-14 DIAGNOSIS — Z7689 Persons encountering health services in other specified circumstances: Secondary | ICD-10-CM

## 2023-11-14 MED ORDER — OMEPRAZOLE 20 MG PO CPDR: 20.0000 mg | DELAYED_RELEASE_CAPSULE | Freq: Every day | ORAL | 0 refills | Status: DC
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
## 2023-11-15 ENCOUNTER — Encounter: Payer: Self-pay | Admitting: Family

## 2023-11-15 LAB — BASIC METABOLIC PANEL
BUN/Creatinine Ratio: 15 (ref 9–20)
BUN: 20 mg/dL (ref 6–24)
CO2: 22 mmol/L (ref 20–29)
Calcium: 9.7 mg/dL (ref 8.7–10.2)
Chloride: 100 mmol/L (ref 96–106)
Creatinine, Ser: 1.34 mg/dL — ABNORMAL HIGH (ref 0.76–1.27)
Glucose: 80 mg/dL (ref 70–99)
Potassium: 4.6 mmol/L (ref 3.5–5.2)
Sodium: 140 mmol/L (ref 134–144)
eGFR: 69 mL/min/{1.73_m2} (ref >59)

## 2023-11-15 LAB — HEMOGLOBIN A1C
Est. average glucose Bld gHb Est-mCnc: 117 mg/dL
Hgb A1c MFr Bld: 5.7 % — ABNORMAL HIGH (ref 4.8–5.6)

## 2023-11-15 LAB — TSH: TSH: 3.76 u[IU]/mL (ref 0.450–4.500)

## 2023-12-01 ENCOUNTER — Ambulatory Visit: Payer: BLUE CROSS/BLUE SHIELD | Admitting: Family Medicine

## 2023-12-15 ENCOUNTER — Ambulatory Visit: Payer: BLUE CROSS/BLUE SHIELD | Admitting: Family

## 2023-12-29 ENCOUNTER — Encounter: Payer: Self-pay | Admitting: Nurse Practitioner

## 2023-12-29 ENCOUNTER — Ambulatory Visit: Payer: BLUE CROSS/BLUE SHIELD | Admitting: Nurse Practitioner

## 2023-12-29 VITALS — BP 116/84 | HR 67 | Temp 98.4°F | Ht 71.0 in | Wt 223.6 lb

## 2023-12-29 DIAGNOSIS — K219 Gastro-esophageal reflux disease without esophagitis: Secondary | ICD-10-CM | POA: Diagnosis not present

## 2023-12-29 DIAGNOSIS — Z7689 Persons encountering health services in other specified circumstances: Secondary | ICD-10-CM | POA: Diagnosis not present

## 2023-12-29 DIAGNOSIS — R519 Headache, unspecified: Secondary | ICD-10-CM | POA: Insufficient documentation

## 2023-12-29 DIAGNOSIS — R5383 Other fatigue: Secondary | ICD-10-CM | POA: Insufficient documentation

## 2023-12-29 DIAGNOSIS — R0683 Snoring: Secondary | ICD-10-CM | POA: Diagnosis not present

## 2023-12-29 NOTE — Assessment & Plan Note (Signed)
Patient currently maintained on omeprazole 20 mg daily.  Continue

## 2023-12-29 NOTE — Assessment & Plan Note (Signed)
 Patient has loud snoring with nonrestorative sleep.  Previous primary care provider was negative sleep study.  Will do at home sleep study order placed today

## 2023-12-29 NOTE — Assessment & Plan Note (Signed)
 Did review most recent family medicine note along with CT scan of head and neck in the emergency department in January 2025.

## 2023-12-29 NOTE — Assessment & Plan Note (Signed)
 Patient's electrolytes and thyroid within normal limits.  Pending at home sleep study
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
## 2023-12-29 NOTE — Patient Instructions (Signed)
 Nice to see you toyda Follow up with me in 6 months for a physical Call and get the information faxed to the office about your MRIs  I have ordered a home sleep test to be done  Once I have the MRI reports I will be in touch
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
## 2023-12-30 ENCOUNTER — Encounter: Payer: Self-pay | Admitting: *Deleted

## 2024-01-27 ENCOUNTER — Ambulatory Visit: Admitting: Nurse Practitioner

## 2024-01-27 VITALS — BP 112/72 | HR 67 | Temp 98.1°F | Ht 71.0 in | Wt 224.8 lb

## 2024-01-27 DIAGNOSIS — M545 Low back pain, unspecified: Secondary | ICD-10-CM | POA: Diagnosis not present
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
## 2024-01-27 NOTE — Patient Instructions (Signed)
 Nice to see you  Follow up with [facility] for that injection  Get the MRI report for me and drop it off at the office Follow up with me as scheduled
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
## 2024-01-27 NOTE — Assessment & Plan Note (Signed)
 Encouraged patient to try already prescribed muscle relaxer along with heat after work. He will follow up with [facility] for the injections they recommended. Did offer that most muscle relaxer's can cause sedation so to use caution

## 2024-02-10 ENCOUNTER — Other Ambulatory Visit: Payer: Self-pay | Admitting: Family

## 2024-02-10 DIAGNOSIS — K219 Gastro-esophageal reflux disease without esophagitis: Secondary | ICD-10-CM

## 2024-02-13 ENCOUNTER — Ambulatory Visit: Admitting: Nurse Practitioner

## 2024-02-16 ENCOUNTER — Ambulatory Visit: Admitting: Nurse Practitioner

## 2024-02-16 VITALS — BP 108/86 | HR 63 | Temp 98.0°F | Ht 71.0 in | Wt 214.0 lb

## 2024-02-16 DIAGNOSIS — L729 Follicular cyst of the skin and subcutaneous tissue, unspecified: Secondary | ICD-10-CM | POA: Diagnosis not present

## 2024-02-16 DIAGNOSIS — E041 Nontoxic single thyroid nodule: Secondary | ICD-10-CM | POA: Insufficient documentation

## 2024-02-16 LAB — CBC WITH DIFFERENTIAL/PLATELET
Basophils Absolute: 0 10*3/uL (ref 0.0–0.1)
Basophils Relative: 0.3 % (ref 0.0–3.0)
Eosinophils Absolute: 0.3 10*3/uL (ref 0.0–0.7)
Eosinophils Relative: 3.7 % (ref 0.0–5.0)
HCT: 41.2 % (ref 39.0–52.0)
Hemoglobin: 13.5 g/dL (ref 13.0–17.0)
Lymphocytes Relative: 17.4 % (ref 12.0–46.0)
Lymphs Abs: 1.4 10*3/uL (ref 0.7–4.0)
MCHC: 32.8 g/dL (ref 30.0–36.0)
MCV: 83.3 fl (ref 78.0–100.0)
Monocytes Absolute: 0.8 10*3/uL (ref 0.1–1.0)
Monocytes Relative: 9.6 % (ref 3.0–12.0)
Neutro Abs: 5.8 10*3/uL (ref 1.4–7.7)
Neutrophils Relative %: 69 % (ref 43.0–77.0)
Platelets: 222 10*3/uL (ref 150.0–400.0)
RBC: 4.94 Mil/uL (ref 4.22–5.81)
RDW: 13.7 % (ref 11.5–15.5)
WBC: 8.3 10*3/uL (ref 4.0–10.5)

## 2024-02-16 NOTE — Assessment & Plan Note (Signed)
 Benign cyst or lymph node.  Forgot the patient about a week status still they are bothersome.  If so we can consider doing a US  abdomen limited.  Pending CBC today

## 2024-02-16 NOTE — Patient Instructions (Signed)
 Nice to see you today I will be in touch with the lab once I have reviewed it  Call   [facility]  [address] [place], [place]  [phone] Open 8am-430pm  To schedule the ultrasound of your thyroid

## 2024-02-16 NOTE — Progress Notes (Signed)
 Acute Office Visit  Subjective:     Patient ID: Gabriel Clarke, male    DOB: 29-Sep-1981, 43 y.o.   MRN: [id number]  Chief Complaint  Patient presents with   Cyst    Pt complains of knot in lower abdomen on R side. States of not being painful but concerned about why the knot showed up. Pt noticed the knot a couple weeks ago.     HPI Patient is in today knot on side for with a history of GERD, snoring, allergies, fatigue, lumbar pain, ED  No significant family history to report   States that he noticed it apporx 2 weeks ago. States tha the noticed it when rubbing his self. States that he did not feel a nite or an injury. States that his appetite has been decreased but that he is under a lot stress. He is not having any stomach pain He does work second shift and does not eat during the day. He is getting a meal around 6-8pm. States that lately he has not been able to eat the whole meal and will have a snack when he gets home after work around 11-12.    Review of Systems  Constitutional:  Negative for chills and fever.       Appetite changes  Gastrointestinal:  Negative for abdominal pain, constipation, diarrhea, nausea and vomiting.  Skin:        " +" skin lesion         Objective:    BP 108/86   Pulse 63   Temp 98 F (36.7 C) (Oral)   Ht 5\' 11"  (1.803 m)   Wt 214 lb (97.1 kg)   SpO2 95%   BMI 29.85 kg/m  BP Readings from Last 3 Encounters:  02/16/24 108/86  01/27/24 112/72  12/29/23 116/84   Wt Readings from Last 3 Encounters:  02/16/24 214 lb (97.1 kg)  01/27/24 224 lb 12.8 oz (102 kg)  12/29/23 223 lb 9.6 oz (101.4 kg)   SpO2 Readings from Last 3 Encounters:  02/16/24 95%  01/27/24 98%  12/29/23 98%      Physical Exam Vitals and nursing note reviewed.  Constitutional:      Appearance: Normal appearance.  Cardiovascular:     Rate and Rhythm: Normal rate and regular rhythm.     Heart sounds: Normal heart sounds.  Pulmonary:     Effort:  Pulmonary effort is normal.     Breath sounds: Normal breath sounds.  Abdominal:     General: Bowel sounds are normal. There is no distension.     Palpations: There is no mass.     Tenderness: There is no abdominal tenderness.     Hernia: No hernia is present.  Skin:    Findings: Lesion present.       Neurological:     Mental Status: He is alert.     No results found for any visits on 02/16/24.      Assessment & Plan:   Problem List Items Addressed This Visit       Endocrine   Thyroid nodule - Primary   Patient to bring in report of cervical spine that showed thyroid nodules recommended dedicated thyroid ultrasound.  Ultrasound placed today patient given information call and set up diagnostic procedure      Relevant Orders   US  THYROID     Musculoskeletal and Integument   Skin cyst   Benign cyst or lymph node.  Forgot the patient about a week  status still they are bothersome.  If so we can consider doing a US  abdomen limited.  Pending CBC today      Relevant Orders   CBC with Differential/Platelet    No orders of the defined types were placed in this encounter.   Return if symptoms worsen or fail to improve.  Margarie Shay, NP

## 2024-02-16 NOTE — Assessment & Plan Note (Signed)
 Patient to bring in report of cervical spine that showed thyroid nodules recommended dedicated thyroid ultrasound.  Ultrasound placed today patient given information call and set up diagnostic procedure

## 2024-02-17 ENCOUNTER — Ambulatory Visit: Payer: Self-pay | Admitting: Nurse Practitioner

## 2024-02-23 ENCOUNTER — Ambulatory Visit
Admission: RE | Admit: 2024-02-23 | Discharge: 2024-02-23 | Disposition: A | Discharge status: Home / Self Care | Source: Ambulatory Visit | Attending: Nurse Practitioner | Admitting: Nurse Practitioner

## 2024-02-23 DIAGNOSIS — E041 Nontoxic single thyroid nodule: Secondary | ICD-10-CM

## 2024-03-09 ENCOUNTER — Ambulatory Visit (HOSPITAL_BASED_OUTPATIENT_CLINIC_OR_DEPARTMENT_OTHER): Admitting: Internal Medicine

## 2024-07-03 ENCOUNTER — Encounter: Admitting: Nurse Practitioner

## 2024-07-03 NOTE — Progress Notes (Deleted)
   Established Patient Office Visit  Subjective   Patient ID: Gabriel Clarke, male    DOB: 06-25-1981  Age: 43 y.o. MRN: [id number]  No chief complaint on file.   HPI  for complete physical and follow up of chronic conditions.  Immunizations: -Tetanus: Completed in 2022 -Influenza:  -Shingles: Too young -Pneumonia: Too young -HPV:  Diet: Fair diet.  Exercise: No regular exercise.  Eye exam: Completes annually  Dental exam: Completes semi-annually    Colonoscopy: Too young, currently average risk Lung Cancer Screening: Completed in   PSA: Too young, currently average risk     {History (Optional):23778}  ROS    Objective:     There were no vitals taken for this visit. {Vitals History (Optional):23777}  Physical Exam   No results found for any visits on 07/03/24.  {Labs (Optional):23779}  The 10-year ASCVD risk score (Arnett DK, et al., 2019) is: 2.6%    Assessment & Plan:   Problem List Items Addressed This Visit   None   No follow-ups on file.    Adina Crandall, NP

## 2024-07-25 ENCOUNTER — Encounter: Payer: Self-pay | Admitting: Nurse Practitioner

## 2024-08-14 ENCOUNTER — Encounter: Payer: Self-pay | Admitting: Internal Medicine

## 2024-10-19 ENCOUNTER — Ambulatory Visit: Payer: Self-pay

## 2024-10-19 NOTE — Telephone Encounter (Signed)
 FYI Only or Action Required?: FYI only for provider: call dropped, no answer on call back.  Patient was last seen in primary care on 02/16/2024 by Gabriel Lynwood HERO, NP.  Called Nurse Triage reporting Abdominal Pain.  Symptoms began several weeks ago.  Interventions attempted: Nothing.  Symptoms are: stable.  Triage Disposition: See Physician Within 24 Hours  Patient/caregiver understands and will follow disposition?: Unsure   Reason for Triage: Hernia, lump on belly abdomen. causing - pain level currently at a 4   Reason for Disposition  [1] MILD pain (e.g., does not interfere with normal activities) AND [2] pain comes and goes (cramps) [3] present > 48 hours  (Exception: This same abdominal pain is a chronic symptom recurrent or ongoing AND present > 4 weeks.)  Answer Assessment - Initial Assessment Questions Call dropped before appt could be made. No answer on call back   1. LOCATION: Where does it hurt?      Left upper abd 2. RADIATION: Does the pain shoot anywhere else? (e.g., chest, back)     denies 3. ONSET: When did the pain begin? (Minutes, hours or days ago)      Several weeks 4. SUDDEN: Gradual or sudden onset?     gradual 5. PATTERN Does the pain come and go, or is it constant?     intermittent 6. SEVERITY: How bad is the pain?  (e.g., Scale 1-10; mild, moderate, or severe)     mild 7. RECURRENT SYMPTOM: Have you ever had this type of stomach pain before? If Yes, ask: When was the last time? and What happened that time?      no 8. CAUSE: What do you think is causing the stomach pain? (e.g., gallstones, recent abdominal surgery)     unknown 9. RELIEVING/AGGRAVATING FACTORS: What makes it better or worse? (e.g., antacids, bending or twisting motion, bowel movement)     no 10. OTHER SYMPTOMS: Do you have any other symptoms? (e.g., back pain, diarrhea, fever, urination pain, vomiting)       =  Protocols used: Abdominal Pain - Male-A-AH

## 2024-10-19 NOTE — Telephone Encounter (Signed)
 noted

## 2024-10-19 NOTE — Telephone Encounter (Signed)
 I spoke with pt and for approx 4 wks or more pt has lump the size of a pecan rt upper abdomen that is intermittently painful with pain level between 4 - 7. Pt said pain is mild right now while pt is getting ready for work. 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 Advised pt he should not do any lifting, pushing or pulling until evaluated. Pt voiced understanding. No N&V and no fever and no urinary symptoms. Pt said he does not need to go to UC or ED. Offered pt appt on .10/22/24 at [facility] with Kenney Roys FNP an pt said when he got to work he would ck and cb to schedule appt. Advised pt to call asap so all appts will not get taken. Pt voiced understanding. UC & ED precautions given and pt voiced understanding., sending note to CHRISTELLA Crandall NP and [employer]. When pt calls back [id number] can schedule appt.

## 2024-10-19 NOTE — Telephone Encounter (Signed)
 Can we triage and schedule an appointment if appropriate

## 2024-10-22 ENCOUNTER — Ambulatory Visit: Admitting: Internal Medicine

## 2024-10-24 ENCOUNTER — Encounter: Payer: Self-pay | Admitting: Family Medicine

## 2024-10-24 ENCOUNTER — Ambulatory Visit: Admitting: Family Medicine

## 2024-10-24 VITALS — BP 124/68 | HR 80 | Resp 16 | Ht 71.0 in | Wt 235.0 lb

## 2024-10-24 DIAGNOSIS — Z113 Encounter for screening for infections with a predominantly sexual mode of transmission: Secondary | ICD-10-CM

## 2024-10-24 DIAGNOSIS — R7303 Prediabetes: Secondary | ICD-10-CM | POA: Diagnosis not present

## 2024-10-24 DIAGNOSIS — K219 Gastro-esophageal reflux disease without esophagitis: Secondary | ICD-10-CM

## 2024-10-24 DIAGNOSIS — R1011 Right upper quadrant pain: Secondary | ICD-10-CM

## 2024-10-24 DIAGNOSIS — Z13220 Encounter for screening for lipoid disorders: Secondary | ICD-10-CM | POA: Diagnosis not present

## 2024-10-24 MED ORDER — PANTOPRAZOLE SODIUM 20 MG PO TBEC: 20.0000 mg | DELAYED_RELEASE_TABLET | Freq: Every day | ORAL | 0 refills | Status: AC

## 2024-10-24 NOTE — Progress Notes (Signed)
 "  New Patient Office Visit  Subjective    Patient ID: Gabriel Clarke, male    DOB: [birth year]  Age: 44 y.o. MRN: [id number]  CC:  Chief Complaint  Patient presents with   Establish Care   Mass    On stomach MARJO), sometimes sharp pain but it is faint. Told it could be a cyst.    HPI Gabriel Clarke presents to establish care. Patient is a pleasant 44 year old male with multiple concerns. He voices he noticed a lump on his abdomen estimated 8 months ago. He voices he brought this up to his last PCP and felt that he did not get a definite answer, voicing he felt brushed off. He voices pain is intermittent, stating not constant or even everyday. He voices pain is in the RUQ.   GERD uncontrolled. He voices complaints of heart burn and indigestion. He has taken OTC Tums and has had to take this quite frequently. He voices OTC Omeprazole  worked sometimes. Will start Pantoprazole  20mg  daily.   He also mentions that he experiences pain in his feet. He describes pain to be in the arch. He voices it started with the right foot and has began to affect the left foot. He voices his job is active and he is on his feet for long periods of time. He voices pain typically improves with rest. He describes pain to be an intense pain that quickly resolves. As noted, he voices rest improves pain and some gentle stretching of the toes also improves pain. Information provided on AVS regarding plantar fasciitis and methods of management including rest, ice and elevation as well as gentle stretches and maneuvers. Recommended insoles with arch support. Will reassess at 2 month f/u, consider podiatry referral.   He was unaware that he had prediabetes. Discussed dietary changes for management of prediabetes. Will update A1c.   Outpatient Encounter Medications as of 10/24/2024  Medication Sig   pantoprazole  (PROTONIX ) 20 MG tablet Take 1 tablet (20 mg total) by mouth daily.   [DISCONTINUED] omeprazole   (PRILOSEC) 20 MG capsule Take 1 capsule (20 mg total) by mouth daily.   No facility-administered encounter medications on file as of 10/24/2024.    Past Medical History:  Diagnosis Date   Asthma    Concussion     No past surgical history on file.  Family History  Problem Relation Age of Onset   Heart attack Paternal Grandfather     Social History   Socioeconomic History   Marital status: Married    Spouse name: Gabriel Clarke   Number of children: Not on file   Years of education: Not on file   Highest education level: Not on file  Occupational History   Not on file  Tobacco Use   Smoking status: Former    Current packs/day: 0.25    Average packs/day: 0.3 packs/day for 13.1 years (3.3 ttl pk-yrs)    Types: Cigarettes    Start date: 10/05/2011   Smokeless tobacco: Never  Vaping Use   Vaping status: Every Day   Substances: Nicotine, Flavoring  Substance and Sexual Activity   Alcohol use: Not Currently    Comment: couple of shots on the weekend   Drug use: Yes    Types: Marijuana    Comment: occ   Sexual activity: Yes  Other Topics Concern   Not on file  Social History Narrative   Fulltime: location manager       Devintiy (19 month)  Ayoun (2)      Isabelle (5)   Social Drivers of Health   Tobacco Use: Medium Risk (10/24/2024)   Patient History    Smoking Tobacco Use: Former    Smokeless Tobacco Use: Never    Passive Exposure: Not on file  Financial Resource Strain: Low Risk (10/24/2024)   Overall Financial Resource Strain (CARDIA)    Difficulty of Paying Living Expenses: Not hard at all  Food Insecurity: No Food Insecurity (10/24/2024)   Epic    Worried About Programme Researcher, Broadcasting/film/video in the Last Year: Never true    Ran Out of Food in the Last Year: Never true  Transportation Needs: No Transportation Needs (10/24/2024)   Epic    Lack of Transportation (Medical): No    Lack of Transportation (Non-Medical): No  Physical Activity: Insufficiently Active (10/24/2024)    Exercise Vital Sign    Days of Exercise per Week: 3 days    Minutes of Exercise per Session: 40 min  Stress: No Stress Concern Present (10/24/2024)   [employer] of Occupational Health - Occupational Stress Questionnaire    Feeling of Stress: Not at all  Social Connections: Moderately Isolated (10/24/2024)   Social Connection and Isolation Panel    Frequency of Communication with Friends and Family: More than three times a week    Frequency of Social Gatherings with Friends and Family: More than three times a week    Attends Religious Services: Never    Database Administrator or Organizations: No    Attends Banker Meetings: Never    Marital Status: Married  Catering Manager Violence: Not At Risk (10/24/2024)   Epic    Fear of Current or Ex-Partner: No    Emotionally Abused: No    Physically Abused: No    Sexually Abused: No  Depression (PHQ2-9): Low Risk (10/24/2024)   Depression (PHQ2-9)    PHQ-2 Score: 0  Alcohol Screen: Low Risk (10/24/2024)   Alcohol Screen    Last Alcohol Screening Score (AUDIT): 1  Housing: Low Risk (10/24/2024)   Epic    Unable to Pay for Housing in the Last Year: No    Number of Times Moved in the Last Year: 0    Homeless in the Last Year: No  Utilities: Not At Risk (10/24/2024)   Epic    Threatened with loss of utilities: No  Health Literacy: Adequate Health Literacy (10/24/2024)   B1300 Health Literacy    Frequency of need for help with medical instructions: Never    Review of Systems  Constitutional:  Negative for chills, fever, malaise/fatigue and weight loss.  Gastrointestinal:  Positive for abdominal pain and heartburn. Negative for blood in stool, constipation, diarrhea, nausea and vomiting.        Objective    BP 124/68   Pulse 80   Resp 16   Ht 5' 11 (1.803 m)   Wt 235 lb (106.6 kg)   SpO2 99%   BMI 32.78 kg/m   Physical Exam Constitutional:      Appearance: Normal appearance.  HENT:     Head:  Normocephalic and atraumatic.  Cardiovascular:     Rate and Rhythm: Normal rate and regular rhythm.  Pulmonary:     Effort: Pulmonary effort is normal.     Breath sounds: Normal breath sounds.  Abdominal:     General: Bowel sounds are normal.     Palpations: Abdomen is soft.     Comments: Palpable lump in the RUQ with  associated pain with palpation.  LLQ, LUQ, and RLQ non-tender to palpation.   Skin:    General: Skin is warm and dry.  Neurological:     General: No focal deficit present.     Mental Status: He is alert.  Psychiatric:        Mood and Affect: Mood normal.        Behavior: Behavior normal.       Assessment & Plan:   Assessment & Plan Right upper quadrant abdominal pain RUQ pain, localized to palpable lump in the RUQ. See HPI for details.  -US  abdomen for further evaluation -F/u in 2 months Orders:   CBC with Differential/Platelet   Comprehensive Metabolic Panel (CMET)   US  Abdomen Complete; Future  Prediabetes Last A1c 5.7 in 11/2023, consistent with prediabetes.  -Recommended dietary changes for management of prediabetes. Written education provided with AVS.  Orders:   HgB A1c   Comprehensive Metabolic Panel (CMET)  Gastroesophageal reflux disease, unspecified whether esophagitis present GERD uncontrolled, see HPI for details.  -Start Pantoprazole  20mg  daily  -Return in 2 months for f/u, reassess GERD symptoms at time of f/u  Orders:   pantoprazole  (PROTONIX ) 20 MG tablet; Take 1 tablet (20 mg total) by mouth daily.  Screening for STD (sexually transmitted disease) Patient agreeable to HIV and Hepatitis C screenings.  Screen for STDs, HIV and Hepatitis C.  Orders:   HIV antibody (with reflex)   Hepatitis C Antibody  Lipid screening Patient is fasting. No previous lipid panel found. Will screen for dyslipidemia/ hyperlipidemia.  Lipid panel ordered.  Orders:   Lipid Profile      Return in about 2 months (around 12/22/2024).   LAYMON LOISE CORE, FNP   "

## 2024-10-24 NOTE — Assessment & Plan Note (Signed)
 GERD uncontrolled, see HPI for details.  -Start Pantoprazole  20mg  daily  -Return in 2 months for f/u, reassess GERD symptoms at time of f/u  Orders:   pantoprazole  (PROTONIX ) 20 MG tablet; Take 1 tablet (20 mg total) by mouth daily.

## 2024-10-25 ENCOUNTER — Ambulatory Visit: Payer: Self-pay | Admitting: Family Medicine

## 2024-10-25 LAB — COMPREHENSIVE METABOLIC PANEL WITH GFR
AG Ratio: 2.4 (calc) (ref 1.0–2.5)
ALT: 31 U/L (ref 9–46)
AST: 34 U/L (ref 10–40)
Albumin: 4.7 g/dL (ref 3.6–5.1)
Alkaline phosphatase (APISO): 47 U/L (ref 36–130)
BUN/Creatinine Ratio: 14 (calc) (ref 6–22)
BUN: 21 mg/dL (ref 7–25)
CO2: 30 mmol/L (ref 20–32)
Calcium: 9.5 mg/dL (ref 8.6–10.3)
Chloride: 103 mmol/L (ref 98–110)
Creat: 1.49 mg/dL — ABNORMAL HIGH (ref 0.60–1.29)
Globulin: 2 g/dL (ref 1.9–3.7)
Glucose, Bld: 78 mg/dL (ref 65–99)
Potassium: 3.9 mmol/L (ref 3.5–5.3)
Sodium: 139 mmol/L (ref 135–146)
Total Bilirubin: 0.8 mg/dL (ref 0.2–1.2)
Total Protein: 6.7 g/dL (ref 6.1–8.1)
eGFR: 60 mL/min/1.73m2

## 2024-10-25 LAB — CBC WITH DIFFERENTIAL/PLATELET
Absolute Lymphocytes: 1972 {cells}/uL (ref 850–3900)
Absolute Monocytes: 768 {cells}/uL (ref 200–950)
Basophils Absolute: 27 {cells}/uL (ref 0–200)
Basophils Relative: 0.4 %
Eosinophils Absolute: 279 {cells}/uL (ref 15–500)
Eosinophils Relative: 4.1 %
HCT: 43 % (ref 39.4–51.1)
Hemoglobin: 13.7 g/dL (ref 13.2–17.1)
MCH: 26.9 pg — ABNORMAL LOW (ref 27.0–33.0)
MCHC: 31.9 g/dL (ref 31.6–35.4)
MCV: 84.3 fL (ref 81.4–101.7)
MPV: 10.6 fL (ref 7.5–12.5)
Monocytes Relative: 11.3 %
Neutro Abs: 3754 {cells}/uL (ref 1500–7800)
Neutrophils Relative %: 55.2 %
Platelets: 210 Thousand/uL (ref 140–400)
RBC: 5.1 Million/uL (ref 4.20–5.80)
RDW: 13 % (ref 11.0–15.0)
Total Lymphocyte: 29 %
WBC: 6.8 Thousand/uL (ref 3.8–10.8)

## 2024-10-25 LAB — HEMOGLOBIN A1C
Hgb A1c MFr Bld: 5.5 %
Mean Plasma Glucose: 111 mg/dL
eAG (mmol/L): 6.2 mmol/L

## 2024-10-25 LAB — LIPID PANEL
Cholesterol: 226 mg/dL — ABNORMAL HIGH
HDL: 51 mg/dL
LDL Cholesterol (Calc): 145 mg/dL — ABNORMAL HIGH
Non-HDL Cholesterol (Calc): 175 mg/dL — ABNORMAL HIGH
Total CHOL/HDL Ratio: 4.4 (calc)
Triglycerides: 161 mg/dL — ABNORMAL HIGH

## 2024-10-25 LAB — HEPATITIS C ANTIBODY: Hepatitis C Ab: NONREACTIVE

## 2024-10-25 LAB — HIV ANTIBODY (ROUTINE TESTING W REFLEX)
HIV 1&2 Ab, 4th Generation: NONREACTIVE
HIV FINAL INTERPRETATION: NEGATIVE

## 2024-10-31 ENCOUNTER — Ambulatory Visit: Admission: RE | Admit: 2024-10-31

## 2024-12-24 ENCOUNTER — Ambulatory Visit: Admitting: Family Medicine
